# Patient Record
Sex: Female | Born: 1985 | Race: White | Hispanic: No | Marital: Married
Health system: Southern US, Community
[De-identification: ages and names within clinical notes are randomized; demographics above are authoritative.]

---

## 2007-07-26 ENCOUNTER — Emergency Department: Payer: Self-pay | Admitting: Unknown Physician Specialty

## 2013-09-19 ENCOUNTER — Observation Stay: Payer: Self-pay

## 2013-09-19 LAB — PROTEIN / CREATININE RATIO, URINE
Creatinine, Urine: 91.7 mg/dL (ref 30.0–125.0)
Protein, Random Urine: 14 mg/dL — ABNORMAL HIGH (ref 0–12)
Protein/Creat. Ratio: 153 mg/gCREAT (ref 0–200)

## 2013-09-23 ENCOUNTER — Inpatient Hospital Stay: Payer: Self-pay

## 2013-09-23 LAB — CBC WITH DIFFERENTIAL/PLATELET
BASOS ABS: 0 10*3/uL (ref 0.0–0.1)
Basophil %: 0.4 %
EOS PCT: 0.7 %
Eosinophil #: 0.1 10*3/uL (ref 0.0–0.7)
HCT: 41.4 % (ref 35.0–47.0)
HGB: 13.6 g/dL (ref 12.0–16.0)
Lymphocyte #: 2.4 10*3/uL (ref 1.0–3.6)
Lymphocyte %: 22.9 %
MCH: 28.5 pg (ref 26.0–34.0)
MCHC: 33 g/dL (ref 32.0–36.0)
MCV: 87 fL (ref 80–100)
MONO ABS: 0.6 x10 3/mm (ref 0.2–0.9)
Monocyte %: 6 %
NEUTROS PCT: 70 %
Neutrophil #: 7.4 10*3/uL — ABNORMAL HIGH (ref 1.4–6.5)
Platelet: 269 10*3/uL (ref 150–440)
RBC: 4.79 10*6/uL (ref 3.80–5.20)
RDW: 13.8 % (ref 11.5–14.5)
WBC: 10.6 10*3/uL (ref 3.6–11.0)

## 2013-09-23 LAB — PROTEIN / CREATININE RATIO, URINE
Creatinine, Urine: 38.4 mg/dL (ref 30.0–125.0)
PROTEIN/CREAT. RATIO: 208 mg/g{creat} — AB (ref 0–200)
Protein, Random Urine: 8 mg/dL (ref 0–12)

## 2013-09-25 LAB — HEMATOCRIT: HCT: 36.1 % (ref 35.0–47.0)

## 2013-12-07 ENCOUNTER — Ambulatory Visit: Payer: Self-pay | Admitting: Surgery

## 2013-12-10 LAB — PATHOLOGY REPORT

## 2014-09-07 NOTE — Op Note (Signed)
PATIENT NAME:  Kylie Santiago, Kylie Santiago MR#:  [id number] DATE OF BIRTH:  [birth year]  DATE OF PROCEDURE:  09/24/2013  PREOPERATIVE DIAGNOSES: 1.  Intrauterine pregnancy at 36 weeks and 3 days gestational age.  2.  Premature preterm rupture of membranes.  3.  Failed induction of labor.   POSTOPERATIVE DIAGNOSES: 1.  Intrauterine pregnancy at 36 weeks and 3 days gestational age.  2.  Premature preterm rupture of membranes.  3.  Failed induction of labor.   PROCEDURES:  A primary low transverse cesarean section via Pfannenstiel incision.   ANESTHESIA: Spinal.   SURGEON: Prentice Docker, M.D.   ASSISTANT: Jaclyn Shaggy L. Danise Mina, CNM  ANESTHESIA: Spinal.   ESTIMATED BLOOD LOSS: 800 mL.   OPERATIVE FLUIDS: 1200 mL crystalloid.   COMPLICATIONS: None.   FINDINGS: 1.  Normal-appearing gravid uterus, fallopian tubes and ovaries.  2.  Viable female infant with Apgars of 9 at one minute and 9 at five minutes.   SPECIMENS: None.   CONDITION AT END OF PROCEDURE: Stable.   PROCEDURE IN DETAIL: The patient was taken to the Operating Room where spinal anesthesia was administered and found to be adequate. She was placed in the dorsal supine position with leftward tilt and prepped and draped in the usual sterile fashion. After a timeout was called, a Pfannenstiel incision was made and carried through the various layers until the peritoneum was identified and entered sharply. The peritoneal opening was extended and a bladder flap was created and a bladder retractor placed to move the bladder out of the operative area of interest. A low transverse hysterotomy was made with a scalpel and extended laterally with cranial and caudal tension. The fetal vertex was grasped and elevated to the hysterotomy. There was quite a bit of difficulty in getting the vertex delivered. A small amount of extension of the opening by making incision into part of the rectus abdominis muscles to help free up space was required. A flat  kiwi was ultimately placed in the correct location on the fetal vertex and with the correct amount of suction using the suction guide on the kiwi, the fetal vertex was delivered. The suction was then released from the kiwi and then the head followed by the shoulders and the rest of body was delivered without difficulty. The cord was clamped and cut and the infant was handed to the pediatrician. Cord blood was collected. The placenta was delivered spontaneously intact with a 3-vessel cord. The uterus was exteriorized and cleared of all clots and debris. The hysterotomy was closed using #0 Vicryl in a running locked fashion. A second layer of the same suture was used to obtain hemostasis. The uterus was returned to the abdomen and the abdomen was cleared of all clots and debris. The peritoneum was reapproximated in a running fashion.   On-Q catheters were placed according to the manufacturer's recommendations. They were inserted at about 4 cm cephalad to the skin incision line and inserted to a depth of approximately the 4th marking on the catheters. They were positioned just superficial to the rectus abdominis muscles and just deep to the rectus fascia. They were approximately 1 cm apart at the skin level, straddling the midline.   The fascia was reapproximated using #0 Maxon using 2 sutures, each starting at the lateral apices until they met in the midline where they were tied together. The subcutaneous tissue was reapproximated using #2-0 plain gut such that no greater than 2 cm of dead space remained.  To reduce tension on  the skin closure, multiple 3-0 Vicryl stitches were thrown in a horizontal mattress-type suture to reapproximate the tissue near the skin for reinforcement of the skin closure. Skin was closed using #3-0 Vicryl on a Keith needle. The skin incision was reinforced using benzoin as well as Steri-Strips.   The On-Q catheters were affixed to the skin using Dermabond as well as Steri-Strips and  Tegaderm. The catheters were bolused with 5 mL of 0.5% Marcaine plain for a total of 10 mL.   The patient tolerated the procedure well. Sponge, lap and needle counts were correct x 2. For antibiotic prophylaxis, she received 3 grams of Ancef prior to skin incision. For VTE prophylaxis, she was wearing pneumatic compression stockings which were on and operating throughout the entire procedure. She was taken to the recovery area in stable condition.   ____________________________ Will Bonnet, MD sdj:cs D: 09/24/2013 14:46:41 ET T: 09/24/2013 15:00:55 ET JOB#: [id number]  cc: Will Bonnet, MD, <Dictator> Will Bonnet MD ELECTRONICALLY SIGNED 10/23/2013 3:44

## 2014-09-07 NOTE — Op Note (Signed)
PATIENT NAME:  Kylie Santiago, Kylie Santiago MR#:  [id number] DATE OF BIRTH:  1985/12/24  DATE OF PROCEDURE:  12/07/2013  PREOPERATIVE DIAGNOSIS: Chronic cholecystitis, cholelithiasis.   POSTOPERATIVE DIAGNOSIS: Chronic cholecystitis, cholelithiasis.   PROCEDURE: Laparoscopic cholecystectomy, cholangiogram.   SURGEON: Renda Rolls, MD  ANESTHESIA: General.   INDICATIONS: This 29 year old female, 2 months postpartum, recently had a bout of abdominal pain with jaundice and CT findings of pancreatitis. Additional lab work demonstrated that her jaundice eventually resolved and she is now brought in for definitive surgical procedure.   DESCRIPTION OF PROCEDURE: The patient was placed on the operating table in the supine position under general endotracheal anesthesia. The abdomen was prepared with ChloraPrep and draped in a sterile manner.   A short incision was made deep in the inferior portion of the umbilicus and carried down to the deep fascia which was grasped with laryngeal hook and elevated. A Veress needle was inserted, aspirated and irrigated with a saline solution. Next, the peritoneal cavity was inflated with carbon dioxide. The Veress needle was removed. The 10 mm cannula was inserted. The 10 mm, 0 degree laparoscope was inserted to view the peritoneal cavity. There was some evidence of fatty liver. There was 1 adhesion between the omentum and the lower abdominal midline. Next, another incision was made in the epigastrium slightly to the right of the midline to introduce an 11 mm cannula. Two incisions were made in the lateral aspect of the right upper quadrant to introduce two 5 mm cannulas.   The patient was placed in the reverse Trendelenburg position and turned several degrees to the left. The gallbladder was retracted towards the right shoulder. The location of the porta hepatis was demonstrated. The infundibulum was retracted inferiorly and laterally. The gallbladder neck was mobilized with incision  of the visceral peritoneum. The cystic duct was dissected free from surrounding structures. The cystic artery was dissected free from surrounding structures. A critical view of safety was demonstrated. An Endo Clip was placed across the cystic duct adjacent to the neck of the gallbladder. Initial attempt to insert the Reddick catheter would not thread, but subsequently a stone was milked out of the cystic duct and subsequently the Reddick catheter went down into the cystic duct. Half-strength Conray-60 dye was injected as the cholangiogram was done with fluoroscopy viewing the biliary tree and prompt flow of dye into the duodenum. Also, the pancreatic duct was visualized and appeared to be normal size. There were no retained stones seen. The cholangiogram appeared normal. The Reddick catheter was removed. The cystic duct was doubly ligated with endoclips and divided. The cystic artery was controlled with double endoclips and divided. The gallbladder was dissected free from the liver with hook and cautery and completely separated. Hemostasis was intact. The gallbladder was delivered up through the infraumbilical incision, opened and suctioned. Multiple stones were removed with the stone forceps and with the stone scoop, and the gallbladder was removed and submitted in formalin with stones for routine pathology. The right upper quadrant was further inspected. Hemostasis was intact. The cannulas were removed allowing carbon dioxide to escape from the peritoneal cavity. A few small subcutaneous bleeding points were cauterized. The wounds were closed with interrupted 5-0 chromic subcuticular suture, benzoin, and Steri-Strips. No Steri-Strips were placed in the navel. Dressings were applied with paper tape. The patient tolerated surgery satisfactorily and was prepared for transfer to the recovery room. ____________________________ Shela Commons. Renda Rolls, MD jws:sb D: 12/07/2013 11:53:59 ET T: 12/07/2013 12:20:41  ET JOB#: [id number]  cc:  Adella HareJ. Wilton Smith, MD, <Dictator> Adella HareWILTON J SMITH MD ELECTRONICALLY SIGNED 12/07/2013 15:05

## 2014-09-24 NOTE — H&P (Signed)
L&D Evaluation:  History:  HPI 29 year old G1 P0 with EDC=10/19/2013 by a 8wk4day ultrasound [facility]. [facility] at which time her BP was 140/80 and her urine dipstick revealed trace protein. She had some earlier BP elevations with systolic readings of 140 at 11 and 24 weeks. She denied headache, visual changes, epigastric pain, but had noticed edema in her face and hands as well as LE.  Labs drawn that day returned with a P/C ratio of 322mg m, plt=288K, ALT=33 (normal to 32), BUN/cr=6/0.59 and Hct=40.1%. She was asked to come in for BP checks, NST, and repeat P/C ratio. [facility] also [facility] for BMI>40, a negative first screen, a negative MSAFP, an elevated one hr glucola at 28 weeks with a normal 3hr GTT, and a weight gain of 20# this pregnancy.   [facility]   Patient's Medical History BMI>40   Patient's Surgical History none   Medications Pre Natal Vitamins   Allergies NKDA   Social History tobacco  stopped with pregnancy   Family History Non-Contributory   ROS:  ROS Denies abdominal pain, N/V, headaches, visual changes. Baby active   Exam:  Vital Signs 123/51, 115/50, 106/44, 108/45   Urine Protein P/C ratio=153mg m   General no apparent distress   Mental Status clear   Chest clear   Heart normal sinus rhythm, no murmur/gallop/rubs   Abdomen gravid, non-tender   Fetal Position cephalic   Edema +1 to +2   Reflexes 1+   Mebranes Intact   FHT 135 baseline with accels to 150s   Ucx absent   Impression:  Impression IUP at 35 6/7 weeks, normotensive with normal P/C ratio   Plan:  Plan FU at office in 2 days for BP check. Preeclampsia precautions given   Electronic Signatures: Trinna BalloonGutierrez, Nikitta Sobiech L (CNM)  (Signed 06-May-15 23:08)  Authored: L&D Evaluation   Last Updated: 06-May-15 23:08 by Trinna BalloonGutierrez, Shantoya Geurts L (CNM)

## 2014-09-24 NOTE — H&P (Signed)
L&D Evaluation:  History:  HPI 29 year old G1 @ 2917w2d by 8wk US derived EDC of 10/19/2013 presenting for evaluation of PPROM.  Clear gush of fluid with continued leakage this afternoon ~1600.  No VB, no ctx, +FM   Presents with leaking fluid   Patient's Medical History obesity    Patient's Surgical History none    Medications Pre Natal Vitamins    Allergies NKDA, other   Social History none    Family History Non-Contributory    ROS:  ROS All systems were reviewed.  HEENT, CNS, GI, GU, Respiratory, CV, Renal and Musculoskeletal systems were found to be normal.   Exam:  Vital Signs 134/77    Urine Protein not completed   General no apparent distress   Mental Status clear    Chest no increased work of breathing    Abdomen gravid, non-tender   Estimated Fetal Weight Average for gestational age   Fetal Position vtx (confirmed by ultrasound)   Back no CVAT   Edema 1+  Pitting    Pelvic no external lesions   Mebranes Ruptured, gross SROM + nitrazine   Description clear   FHT normal rate with no decels   Ucx irregular   Plan:  Plan 29 year old G1 at 4617w2d by 8wk US derived EDC of 10/20/2013 presenting with PPROM   Comments 1) R/O PPROM - gross preterm premature rupture of membranes     - start pitocin  2) Fetus - category I tracing     - prepregnancy weight 230lbs, 25lbs weight gain this pregnancy     - early 1-hr 85, 28 week 1-hr 143 with follow up 3-hr  normal (78, 127, 132, 119)     - growth scan on 08/30/13 at 7630w6d 2499g (5lbs 8oz) c/w 73.9%ile  3) PNL - O positive / ABSC neg / RI / VZNI / HIV neg / HBsAg neg / RPR NR / 1st trimester screen negative / CF negative / MSAFP negative / early 1-hr 85, 28 week 1-hr 143 with follow up 3-hr  normal (78, 127, 132, 119) / UCx negative / Pap ASCUS HPV positive / GBS negative 5/8     - varivax postpartum     - pap vs colposcopy postpartum   4) TDAP - received 08/09/2013, also received influenza vaccination  02/2013  5) Superimposed pre-eclampsia vs preeclampsia without severe features - Elevated BP's dating back to 11 weeks (into 140's systolic).  24-hr urine 134.4 at 08/17/2013, spot Pr/Cr ratio on 4/30 321.8     - PIH panel with admission labs  6) Disposition - pending delivery anticipated SVD   Electronic Signatures: Lorrene ReidStaebler, Orella Cushman M (MD)  (Signed 10-May-15 18:24)  Authored: L&D Evaluation   Last Updated: 10-May-15 18:24 by Lorrene ReidStaebler, Traniyah Hallett M (MD)

## 2015-01-13 IMAGING — CR DG CHOLANGIOGRAM OPERATIVE
1 series · 4 of 4 positions shown · non-contrast
Comparison: None.

CLINICAL DATA: Cholecystectomy for cholelithiasis.

EXAM:
INTRAOPERATIVE CHOLANGIOGRAM
TECHNIQUE: Cholangiographic images from the C-arm fluoroscopic device were
submitted for interpretation post-operatively. Please see the
procedural report for the amount of contrast and the fluoroscopy
time utilized.

[Series 5: cont. · 4 of 4 frames shown]
[frame 1/4]
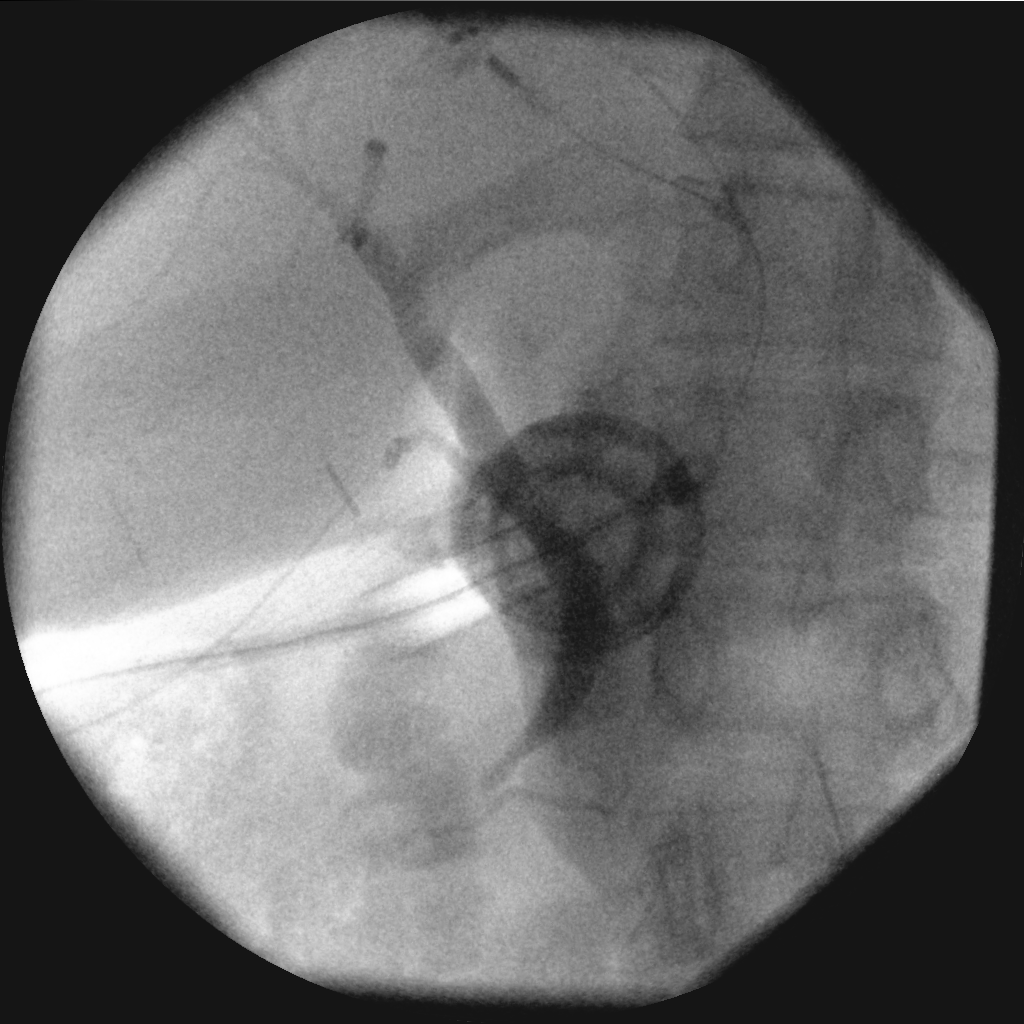
[frame 2/4]
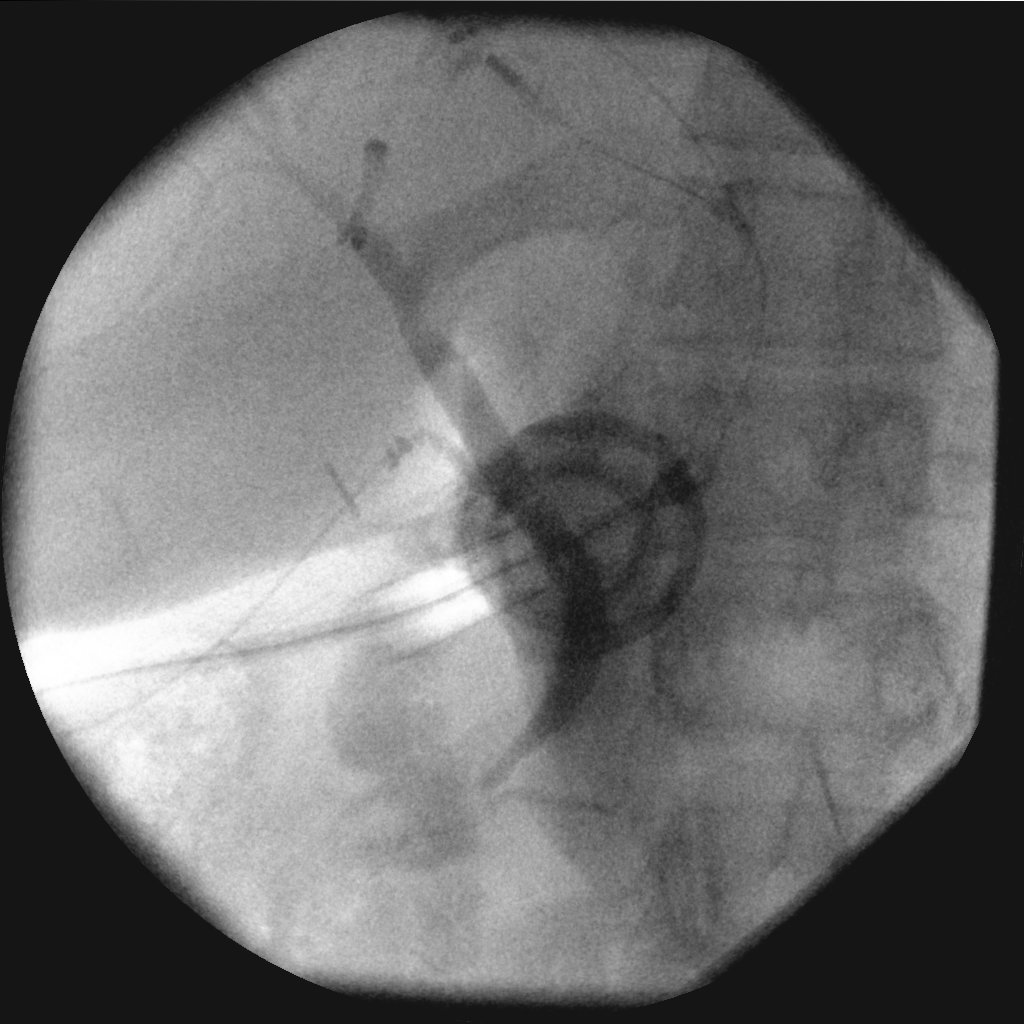
[frame 3/4]
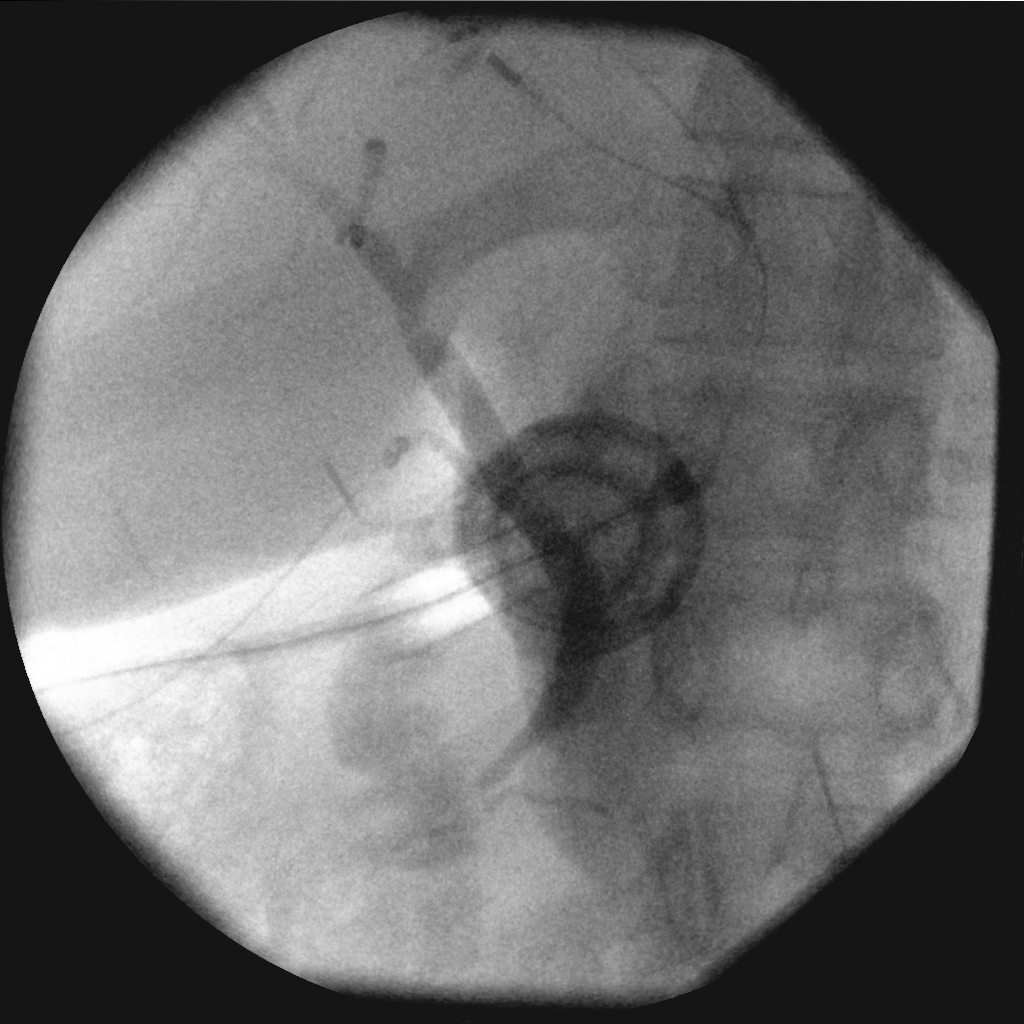
[frame 4/4]
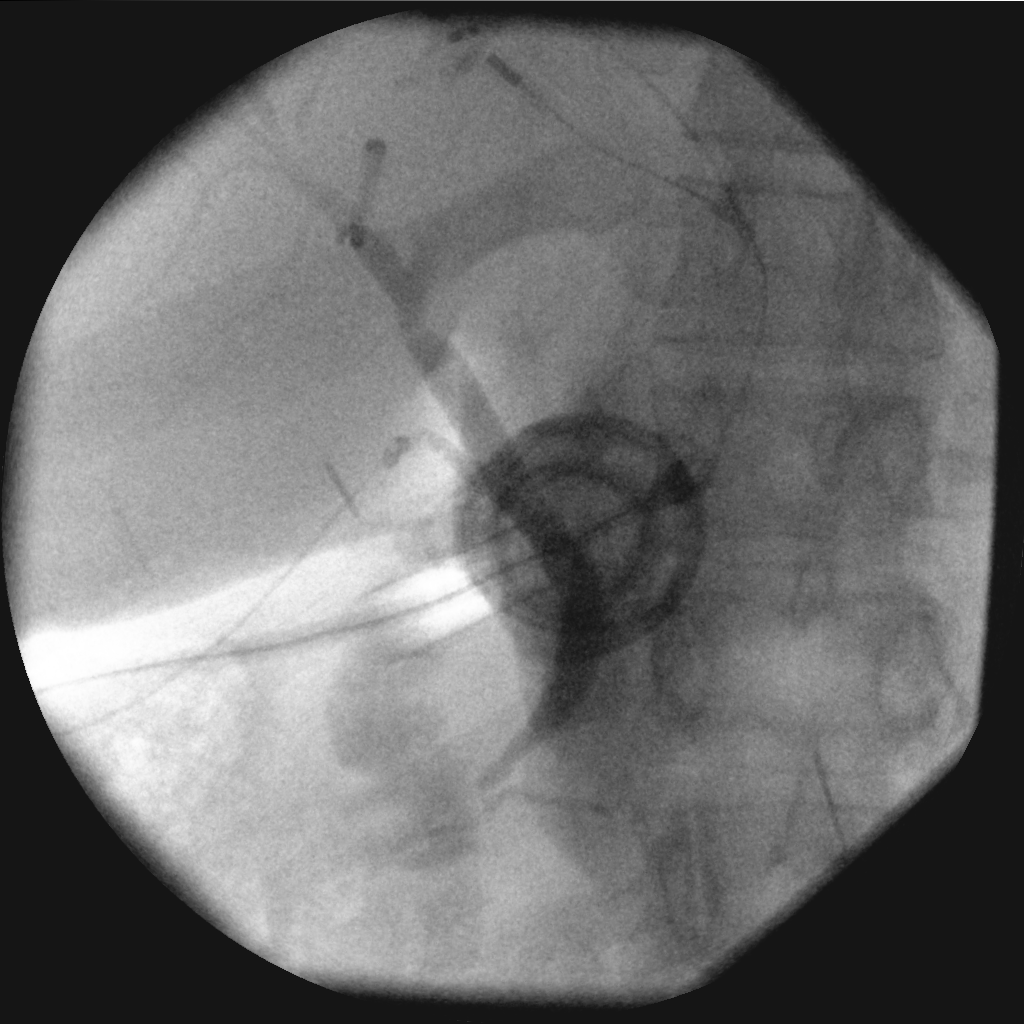

[4 of 4 positions shown; findings below may reference images not displayed]

FINDINGS: Intraoperative cholangiogram shows normal caliber bile ducts without
filling defects, obstruction or contrast extravasation. The duodenum
is faintly visualized.
IMPRESSION: Unremarkable intraoperative cholangiogram.
